# Patient Record
Sex: Female | Born: 1990 | Race: Black or African American | Hispanic: No | Marital: Single | Smoking: Former smoker
Health system: Southern US, Community
[De-identification: ages and names within clinical notes are randomized; demographics above are authoritative.]

## PROBLEM LIST (undated history)

## (undated) DIAGNOSIS — B009 Herpesviral infection, unspecified: Secondary | ICD-10-CM

## (undated) HISTORY — PX: WISDOM TOOTH EXTRACTION: SHX21

## (undated) HISTORY — PX: APPENDECTOMY: SHX54

## (undated) HISTORY — DX: Herpesviral infection, unspecified: B00.9

## (undated) HISTORY — PX: ANKLE SURGERY: SHX546

---

## 2010-11-10 ENCOUNTER — Emergency Department (HOSPITAL_COMMUNITY)
Admission: EM | Admit: 2010-11-10 | Discharge: 2010-11-11 | Disposition: A | Discharge status: Home / Self Care | Payer: Medicaid Other | Attending: Emergency Medicine | Admitting: Emergency Medicine

## 2010-11-10 DIAGNOSIS — J020 Streptococcal pharyngitis: Secondary | ICD-10-CM | POA: Insufficient documentation

## 2010-11-10 DIAGNOSIS — R599 Enlarged lymph nodes, unspecified: Secondary | ICD-10-CM | POA: Insufficient documentation

## 2010-11-10 LAB — RAPID STREP SCREEN (MED CTR MEBANE ONLY): Streptococcus, Group A Screen (Direct): NEGATIVE

## 2012-04-19 ENCOUNTER — Emergency Department (HOSPITAL_COMMUNITY): Payer: Medicaid Other

## 2012-04-19 ENCOUNTER — Encounter (HOSPITAL_COMMUNITY): Payer: Self-pay | Admitting: *Deleted

## 2012-04-19 ENCOUNTER — Emergency Department (HOSPITAL_COMMUNITY)
Admission: EM | Admit: 2012-04-19 | Discharge: 2012-04-20 | Disposition: A | Discharge status: Home / Self Care | Payer: Medicaid Other | Attending: Emergency Medicine | Admitting: Emergency Medicine

## 2012-04-19 DIAGNOSIS — M545 Low back pain, unspecified: Secondary | ICD-10-CM | POA: Insufficient documentation

## 2012-04-19 DIAGNOSIS — T148XXA Other injury of unspecified body region, initial encounter: Secondary | ICD-10-CM

## 2012-04-19 DIAGNOSIS — F17200 Nicotine dependence, unspecified, uncomplicated: Secondary | ICD-10-CM | POA: Insufficient documentation

## 2012-04-19 DIAGNOSIS — IMO0002 Reserved for concepts with insufficient information to code with codable children: Secondary | ICD-10-CM | POA: Insufficient documentation

## 2012-04-19 DIAGNOSIS — M546 Pain in thoracic spine: Secondary | ICD-10-CM | POA: Insufficient documentation

## 2012-04-19 DIAGNOSIS — Z880 Allergy status to penicillin: Secondary | ICD-10-CM | POA: Insufficient documentation

## 2012-04-19 DIAGNOSIS — M542 Cervicalgia: Secondary | ICD-10-CM | POA: Insufficient documentation

## 2012-04-19 MED ORDER — OXYCODONE-ACETAMINOPHEN 5-325 MG PO TABS
2.0000 | ORAL_TABLET | Freq: Once | ORAL | Status: AC
  Administered 2012-04-19: 2 via ORAL
  Filled 2012-04-19: qty 2

## 2012-04-19 MED ORDER — DIAZEPAM 5 MG PO TABS
5.0000 mg | ORAL_TABLET | Freq: Once | ORAL | Status: AC
  Administered 2012-04-19: 5 mg via ORAL
  Filled 2012-04-19: qty 1
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
## 2012-04-20 ENCOUNTER — Emergency Department (HOSPITAL_COMMUNITY): Payer: Medicaid Other

## 2012-04-20 MED ORDER — OXYCODONE-ACETAMINOPHEN 5-325 MG PO TABS: 2.0000 | ORAL_TABLET | ORAL | Status: DC | PRN

## 2012-04-20 MED ORDER — IBUPROFEN 800 MG PO TABS: 800.0000 mg | ORAL_TABLET | Freq: Three times a day (TID) | ORAL | Status: DC

## 2012-04-20 MED ORDER — DIAZEPAM 5 MG PO TABS: 5.0000 mg | ORAL_TABLET | Freq: Three times a day (TID) | ORAL | Status: DC | PRN
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
## 2012-04-20 NOTE — ED Notes (Signed)
Patient transported to X-ray 

## 2013-09-04 ENCOUNTER — Encounter: Payer: Self-pay | Admitting: Obstetrics & Gynecology

## 2013-09-04 ENCOUNTER — Ambulatory Visit (INDEPENDENT_AMBULATORY_CARE_PROVIDER_SITE_OTHER): Payer: BC Managed Care – PPO | Admitting: Obstetrics & Gynecology

## 2013-09-04 VITALS — BP 114/77 | HR 52 | Temp 98.9°F | Ht 60.0 in | Wt 205.0 lb

## 2013-09-04 DIAGNOSIS — N760 Acute vaginitis: Secondary | ICD-10-CM

## 2013-09-04 NOTE — Progress Notes (Signed)
Subjective:     Ashley Chavez is a 23 y.o. female here for a routine exam.  Current complaints: patient in office today for a problem visit.  Patient has had her Nexplanon inserted in November 2013. Patient states she is having vaginal pain. Patient states she is also sometimes having leg pain. Patient is concerned about her weight gain. Patient has gained 30 lbs since insertion.   Personal health questionnaire reviewed: yes.   Gynecologic History Patient's last menstrual period was 08/27/2013. Contraception: Nexplanon Last Pap: never had a pap  Obstetric History OB History  Gravida Para Term Preterm AB SAB TAB Ectopic Multiple Living  1    1         # Outcome Date GA Lbr Len/2nd Weight Sex Delivery Anes PTL Lv  1 ABT 03/2012        N       The following portions of the patient's history were reviewed and updated as appropriate: allergies, current medications, past family history, past medical history, past social history, past surgical history and problem list.  Review of Systems Pertinent items are noted in HPI.   Objective:    BP 114/77  Pulse 52  Temp(Src) 98.9 F (37.2 C)  Ht 5' (1.524 m)  Wt 205 lb (92.987 kg)  BMI 40.04 kg/m2  LMP 08/27/2013  General Appearance:    Alert, cooperative, no distress, appears stated age  Abdomen:     Soft, non-tender, bowel sounds active all four quadrants,    no masses, no organomegaly  Genitalia:    Normal female without lesion, discharge or tenderness   U/S by me: 2 cm, simple cyst left ovary  Assessment:   Vague GU complaints--?etiology   Plan:  Counseled re: diet/exercise Referral-->nutritionist Return prn

## 2013-09-05 LAB — WET PREP BY MOLECULAR PROBE
Candida species: POSITIVE — AB
Gardnerella vaginalis: NEGATIVE
TRICHOMONAS VAG: NEGATIVE

## 2013-09-21 ENCOUNTER — Telehealth: Payer: Self-pay | Admitting: *Deleted

## 2013-09-21 MED ORDER — FLUCONAZOLE 150 MG PO TABS: 150.0000 mg | ORAL_TABLET | Freq: Every day | ORAL | Status: AC

## 2013-09-21 NOTE — Telephone Encounter (Signed)
Message copied by Glendell DockerKNIGHT, Carrick Rijos on Sat Sep 21, 2013 12:32 PM ------      Message from: Antionette CharJACKSON-MOORE, LISA      Created: Sun Sep 08, 2013  4:51 PM       Offer rx for candida ------

## 2013-09-21 NOTE — Telephone Encounter (Signed)
Call placed to patient. Patient was advised per Dr Tamela OddiJackson-Moore instruction.  Rx sent to pharmacy per protocol.

## 2013-11-18 ENCOUNTER — Encounter: Payer: BC Managed Care – PPO | Attending: Obstetrics & Gynecology | Admitting: *Deleted

## 2013-11-18 ENCOUNTER — Encounter: Payer: Self-pay | Admitting: *Deleted

## 2013-11-18 VITALS — Ht 61.0 in | Wt 198.0 lb

## 2013-11-18 DIAGNOSIS — Z713 Dietary counseling and surveillance: Secondary | ICD-10-CM | POA: Insufficient documentation

## 2013-11-18 DIAGNOSIS — E669 Obesity, unspecified: Secondary | ICD-10-CM | POA: Diagnosis not present

## 2013-11-18 NOTE — Patient Instructions (Addendum)
C[employer] Breakfast Essentials instead of [employer] Shake made with soy milk as meal replacement Stop taking all those supplements except multivitamin Calorie goals: 1500 kcal/day Try to pack some baby carrots or celery to get some more vegetables in Walk around kiosk every 30-45 minutes or so Allow sweets Aim for 3 meals/day or meal replacement shake.  No meal skipping!

## 2013-11-18 NOTE — Progress Notes (Signed)
Medical Nutrition Therapy:  Appt start time: 0930 end time:  1030.  Assessment:  Primary concerns today: Ashley Chavez is here for nutrition counseling pertaining to obesity.  She reports gaining 40+ pound over the past 2 years.  She feels like she has been trying to lose weight, but she feels like she needs more guidance.  She wants to lose 50 pounds.  She takes a lot of nutrition supplements from [employer] [facility] [employer] to facilitate weight loss.   She changed her diet, but doesn't feel like that is effective. She works a lot and doesn't have time to exercise.  She tries to eat <1250 kcal/day and drink 32 oz water/day.  She feels like she cant' stay away from sweets and bread.  She got the number 1250 kcal from a tv show and she doesn't know what it was.  She uses My Fitness Pal to keep track of her calories.  At her last MD appointment she weighed 206 lb (the visit before that she was 214 lb) and she weighs herself at friends' houses and thinks she weighs about 191 lb.  She actually weighs 198 lb.  She follows a very healthy diet and always has.  Gets full easily.  Very seldom eats at home.  She currently have the implanon injection for birth control.  Prior to the injection in 2013 her periods were really heavy and irregular.  Her first period was at age 23.  She confirms facial hair and body acne.  She reports being a healthy size up until 9th grade when she gained a lot of weight.  SHe started playing sports and lost some.  She maintained around 150-160 lb.  Her weight has cycled over the years, depending on illness and injury and activity level.  She does not lose weight easily.  The women on mom's side are thin and petite and she denies family history of overweight/obesity.  She is not happy with her birth control method and feels it affect her appetite.  She is a slow eater and doesn't usually eat distracted  Preferred Learning Style:   Auditory   Learning Readiness:   Change in progress   MEDICATIONS: see list    DIETARY INTAKE:  Usual eating pattern includes 1-2 meals and 2-3 snacks per day.  Everyday foods include processed diet foods.  Avoided foods include sweets, "junk", beef.    24-hr recall:  B ( AM): sometimes skips.  Sometimes eats special k breakfast sandwich with fruit.  Might have Lean Shake from [facility]  Snk ( AM): Special K bar, yogurt or pistachios   L ( PM): sometimes skips.  Might have frozen meal from [employer]. Might have veggie sticks.  Likes chick-fil-a with fruit.  Sometimes eats at [employer] if she doesn't pack lunch for work Snk ( PM): popcorn or fruit D ( PM): sometimes not.   Snk ( PM): none Beverages: no sodas. Lots of water, likes diet green tea.  G2 Gatorade (8-10 oz)  On her free day might "binge" eat: if she "ate good" will have cake or chips, [employer].  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  As evidenced by dietary recall.    Intervention:  Nutrition counseling provided.  Suggested asking for additional lab testing to  rule out PCOS or hypothyroidism: Thyroid panel Insulin Estrogen Testosterone  Goals:  AcupuncturistCarnation Breakfast Essentials instead of Lean Shake made with soy milk as meal replacement Stop taking all those supplements except multivitamin Calorie goals: 1500 kcal/day Try to pack some baby carrots or celery to get some more vegetables in Walk around kiosk every 30-45 minutes or so Allow sweets Aim for 3 meals/day or meal replacement shake.  No meal skipping!  Teaching Method Utilized:  Auditory   Barriers to learning/adherence to lifestyle change: work schedule  Demonstrated degree of understanding via:  Teach Back   Monitoring/Evaluation:  Dietary intake, exercise, lab data, and body weight  prn. After lab testing completed
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
## 2014-06-02 ENCOUNTER — Encounter: Payer: Self-pay | Admitting: *Deleted

## 2014-07-28 ENCOUNTER — Encounter: Payer: Self-pay | Admitting: *Deleted

## 2015-02-13 ENCOUNTER — Telehealth: Payer: Self-pay | Admitting: *Deleted

## 2015-02-13 NOTE — Telephone Encounter (Signed)
Attempted to contact the patient in regards to scheduling her Nexplanon Removal.

## 2015-04-21 NOTE — Telephone Encounter (Signed)
Patient returned call to the office requesting Nexplanon removal. Attempted to return patient's call. Left message for patient to call the office.

## 2015-05-08 NOTE — Telephone Encounter (Signed)
Patient has been scheduled for a Nexplanon Removal on 05-14-15 @ 11 am.

## 2015-05-14 ENCOUNTER — Ambulatory Visit (INDEPENDENT_AMBULATORY_CARE_PROVIDER_SITE_OTHER): Payer: Medicaid Other | Admitting: Certified Nurse Midwife

## 2015-05-14 ENCOUNTER — Encounter: Payer: Self-pay | Admitting: *Deleted

## 2015-05-14 ENCOUNTER — Encounter: Payer: Self-pay | Admitting: Certified Nurse Midwife

## 2015-05-14 VITALS — BP 104/65 | HR 57 | Temp 98.2°F | Ht 60.0 in | Wt 160.0 lb

## 2015-05-14 DIAGNOSIS — Z30018 Encounter for initial prescription of other contraceptives: Secondary | ICD-10-CM

## 2015-05-14 DIAGNOSIS — Z3046 Encounter for surveillance of implantable subdermal contraceptive: Secondary | ICD-10-CM

## 2015-05-14 DIAGNOSIS — Z3049 Encounter for surveillance of other contraceptives: Secondary | ICD-10-CM

## 2015-05-14 MED ORDER — ETONOGESTREL-ETHINYL ESTRADIOL 0.12-0.015 MG/24HR VA RING: 1.0000 | VAGINAL_RING | VAGINAL | Status: AC

## 2015-05-14 MED ORDER — ETONOGESTREL-ETHINYL ESTRADIOL 0.12-0.015 MG/24HR VA RING: 1.0000 | VAGINAL_RING | VAGINAL | Status: DC

## 2015-05-14 NOTE — Progress Notes (Signed)
Patient ID: Ashley Chavez, female   DOB: 07/26/1991, 24 y.o.   MRN: [id number]  NEXPLANON REMOVAL NOTE  Date of LMP:   04/29/15  Contraception used: *Nexplanon   Indications:  The patient desires contraception.  She understands risks, benefits, and alternatives to Implanon and would like to proceed.  Anesthesia:   Lidocaine 1% plain.  Procedure:  A time-out was performed confirming the procedure and the patient's allergy status.  Complications: None                      The rod was palpated and the area was sterilely prepped.  The area beneath the distal tip was anesthetized with 1% xylocaine and the skin incised                       Over the tip and the tip was exposed, grasped with forcep and removed intact.   Steri strip and a bandage applied and the arm was wrapped with gauze bandage.  The patient tolerated well.  Instructions:  The patient was instructed to remove the dressing in 24 hours and that some bruising is to be expected.  She was advised to use over the counter analgesics as needed for any pain at the site.  She is to keep the area dry for 24 hours and to call if her hand or arm becomes cold, numb, or blue.  Return visit:  Return in 4 weeks for contraception management.   Starting [employer].

## 2015-06-16 ENCOUNTER — Ambulatory Visit: Payer: Medicaid Other | Admitting: Certified Nurse Midwife
